# Patient Record
Sex: Male | Born: 1970 | Race: White | Hispanic: No | Marital: Married | Smoking: Never smoker
Health system: Southern US, Community
[De-identification: ages and names within clinical notes are randomized; demographics above are authoritative.]

## PROBLEM LIST (undated history)

## (undated) HISTORY — PX: TONSILLECTOMY: SHX5217

## (undated) HISTORY — PX: OTHER SURGICAL HISTORY: SHX169

---

## 2006-05-21 ENCOUNTER — Ambulatory Visit (HOSPITAL_BASED_OUTPATIENT_CLINIC_OR_DEPARTMENT_OTHER): Admission: RE | Admit: 2006-05-21 | Discharge: 2006-05-21 | Payer: Self-pay | Admitting: Otolaryngology

## 2006-05-21 ENCOUNTER — Encounter (INDEPENDENT_AMBULATORY_CARE_PROVIDER_SITE_OTHER): Payer: Self-pay | Admitting: Specialist

## 2008-10-14 ENCOUNTER — Emergency Department (HOSPITAL_COMMUNITY): Admission: EM | Admit: 2008-10-14 | Discharge: 2008-10-14 | Payer: Self-pay | Admitting: Emergency Medicine

## 2010-10-10 NOTE — Op Note (Signed)
NAMEAKSHITH, MONCUS              ACCOUNT NO.:  [id number]   MEDICAL RECORD NO.:  [id number]          PATIENT TYPE:  AMB   LOCATION:  DSC                          FACILITY:  [facility]   PHYSICIAN:  Christopher E. Ezzard Standing, M.D.DATE OF BIRTH:  1970-06-02   DATE OF PROCEDURE:  05/21/2006  DATE OF DISCHARGE:                               OPERATIVE REPORT   PREOPERATIVE DIAGNOSIS:  Left peritonsillar abscess.   POSTOPERATIVE DIAGNOSIS:  Left peritonsillar abscess.   OPERATIONS:  Left tonsillectomy.   SURGEON:  Kristine Garbe. Ezzard Standing, M.D.   ANESTHESIA:  General endotracheal.   COMPLICATIONS:  None.   BRIEF CLINICAL NOTE:  Carlos Estes is a 40 year old gentleman who has  had intermittent problems with left-sided sore throats.  He had one  episode back in February and then another episode just recently in  November and December where he had persistent left throat pain and left  ear pain.  He was treated with antibiotics.  On examination, he has what  appears to be either cellulitis or abscess in the left peritonsillar  area.  Of note, he had tonsillectomy performed when he was a teenager,  and on exam in the office he appears to have some residual left superior  tonsillar tissue, and he has either cellulitis or abscess secondary to  this remaining tonsil tissue.  Because of recurrent problems, he is  taken to the operating room at this time for a left tonsillectomy.   DESCRIPTION OF PROCEDURE:  After adequate endotracheal anesthesia, the  patient received 1 gram of Ancef  and 10 mg of Decadron IV  preoperatively.  The patient had some induration and firm mass in the  left peritonsillar area superiorly.  This tissue was excised along with  a portion of the soft palate.  Dissection was carried down to the  pharyngeal muscles.  There was inflamed tonsil tissue in this area that  was removed.  Hemostasis was obtained with cautery.  The specimen was  sent to pathology.  Carlos Estes tolerated  this well.  He was awakened from  anesthesia and transferred to the recovery room postoperatively doing  well.   DISPOSITION:  Carlos Estes was discharged home later this morning on  amoxicillin suspension 500 mg b.i.d. for 1 week, Lortab elixir 1-2  tablespoons q.4h. p.r.n. pain, Phenergan for nausea.  We will have him  follow up in my office in 2 weeks for recheck.           ______________________________  Kristine Garbe. Ezzard Standing, M.D.    CEN/MEDQ  D:  05/21/2006  T:  05/21/2006  Job:  [id number]   cc:   Sigmund Hazel, M.D.

## 2011-09-11 ENCOUNTER — Ambulatory Visit: Payer: BC Managed Care – PPO

## 2011-09-11 ENCOUNTER — Telehealth: Payer: Self-pay | Admitting: Family Medicine

## 2011-09-11 ENCOUNTER — Ambulatory Visit: Payer: BC Managed Care – PPO | Admitting: Physician Assistant

## 2011-09-11 DIAGNOSIS — R079 Chest pain, unspecified: Secondary | ICD-10-CM

## 2011-09-11 LAB — POCT CBC
Lymph, poc: 1.9 (ref 0.6–3.4)
MCH, POC: 30 pg (ref 27–31.2)
MCHC: 33.8 g/dL (ref 31.8–35.4)
MID (cbc): 0.4 (ref 0–0.9)
MPV: 9.7 fL (ref 0–99.8)
POC MID %: 6.3 %M (ref 0–12)
Platelet Count, POC: 253 10*3/uL (ref 142–424)
WBC: 6.5 10*3/uL (ref 4.6–10.2)

## 2011-09-11 LAB — COMPREHENSIVE METABOLIC PANEL
AST: 20 U/L (ref 0–37)
Alkaline Phosphatase: 65 U/L (ref 39–117)
BUN: 14 mg/dL (ref 6–23)
Calcium: 9.1 mg/dL (ref 8.4–10.5)
Chloride: 107 mEq/L (ref 96–112)
Creat: 1.6 mg/dL — ABNORMAL HIGH (ref 0.50–1.35)

## 2011-09-11 LAB — LIPID PANEL
HDL: 43 mg/dL (ref >39)
Total CHOL/HDL Ratio: 4.4 Ratio
Triglycerides: 163 mg/dL — ABNORMAL HIGH (ref <150)

## 2011-09-11 LAB — TSH: TSH: 1.778 u[IU]/mL (ref 0.350–4.500)

## 2011-09-11 MED ORDER — ALPRAZOLAM 0.5 MG PO TABS: 0.5000 mg | ORAL_TABLET | Freq: Two times a day (BID) | ORAL | Status: AC

## 2011-09-11 NOTE — Patient Instructions (Signed)
We will call with your labs tonight.  If your symptoms worsen, go to the ER.

## 2011-09-11 NOTE — Progress Notes (Signed)
Subjective:    Patient ID: Carlos Estes, male    DOB: 1970-10-03, 41 y.o.   MRN: [id number]  HPI Carlos Estes comes in today with his wife c/o off and on chest pain for 2 weeks.  Feels chest tightness at times with difficulty taking a deep breath.  Has had an episode that his left arm felt "funny".  No diaphoresis or nausea. Happens at rest and with exertion.  Never wakes him from his sleep. Last week he had a vision change and saw a white shiny light in the upper outer margin of his right eye that lasted for ~ 20 minutes.  He was just about to play soccer and continued into the game with full resolution of this symptom.  No HA, dizziness or palpatiions at the time.  That has never happened in the past.  He does report that the chest tightness has been occurring since working more hours since January.  Admits that he is under a great deal of stress and his wife agrees with this.  Job, family life is stressful.  No exercise except for soccer.  Non smoker.  ETOH at night to take the edge of occasionally,  Not sleeping well.  Wakes a lot because his mind is racing.  Has trouble all day long with his mind racing and worrying.  Very worried this is a cardiac problem. GF is the only family member with MI at 43.  Pt has not had any health maintenance in many years.   Review of Systems As above    Objective:   Physical Exam  Constitutional: He is oriented to person, place, and time. Vital signs are normal. He appears well-developed and well-nourished. He does not have a sickly appearance. He does not appear ill. He appears distressed (anxious).  HENT:  Mouth/Throat: Oropharynx is clear and moist.  Eyes: Conjunctivae, EOM and lids are normal. Pupils are equal, round, and reactive to light.  Neck: No mass and no thyromegaly present.  Cardiovascular: Normal rate and regular rhythm.   Pulmonary/Chest: Effort normal and breath sounds normal.  Abdominal: Normal appearance and bowel sounds are normal. There is  no tenderness.  Lymphadenopathy:    He has no cervical adenopathy.  Neurological: He is alert and oriented to person, place, and time.  Skin: Skin is warm. He is not diaphoretic. No pallor.  Psychiatric: His speech is normal and behavior is normal. Judgment and thought content normal. His mood appears anxious. Cognition and memory are normal.   EKG read with Dr. Conley Rolls NL  [facility] reading (PRIMARY) by  Dr. Conley Rolls. Normal CXR   Results for orders placed in visit on 09/11/11  POCT CBC      Component Value Range   WBC 6.5  4.6 - 10.2 (K/uL)   Lymph, poc 1.9  0.6 - 3.4    POC LYMPH PERCENT 30.0  10 - 50 (%L)   MID (cbc) 0.4  0 - 0.9    POC MID % 6.3  0 - 12 (%M)   POC Granulocyte 4.1  2 - 6.9    Granulocyte percent 63.7  37 - 80 (%G)   RBC 5.10  4.69 - 6.13 (M/uL)   Hemoglobin 15.3  14.1 - 18.1 (g/dL)   HCT, POC 40.9  81.1 - 53.7 (%)   MCV 88.8  80 - 97 (fL)   MCH, POC 30.0  27 - 31.2 (pg)   MCHC 33.8  31.8 - 35.4 (g/dL)   RDW, POC 13.5  Platelet Count, POC 253  142 - 424 (K/uL)   MPV 9.7  0 - 99.8 (fL)  GLUCOSE, POCT (MANUAL RESULT ENTRY)      Component Value Range   POC Glucose 76             Assessment & Plan:  Chest pain/tightness, likely stress related  Stress  Discussed with Dr. Conley Rolls.  I feel that pt is having these symptoms from the excess stress but concern for cardiac etiology is present but minimal.  Will check CMET, TSH, Lipid and Troponin level.  If elevated, he is instructed to go to the ER.  If normal, reassurance that this is not cardiac related and likely due to stress.  Discussed use of SSRI and Xanax to temporize his symptoms and reviewed relaxation techniques.  He and his wife agree with this plan and will discuss the use of meds while we await Troponin levels.  He is given a copy of his EKG and will go to the ER if symptoms worsen at all.

## 2011-09-11 NOTE — Telephone Encounter (Signed)
Spoke with patient regarding negative xray and troponin.

## 2011-10-01 ENCOUNTER — Encounter: Payer: Self-pay | Admitting: Physician Assistant

## 2012-09-25 ENCOUNTER — Ambulatory Visit: Payer: BC Managed Care – PPO | Admitting: Family Medicine

## 2012-09-25 ENCOUNTER — Ambulatory Visit: Payer: BC Managed Care – PPO

## 2012-09-25 DIAGNOSIS — R079 Chest pain, unspecified: Secondary | ICD-10-CM

## 2012-09-25 DIAGNOSIS — F411 Generalized anxiety disorder: Secondary | ICD-10-CM

## 2012-09-25 MED ORDER — ALPRAZOLAM 0.5 MG PO TBDP: 0.5000 mg | ORAL_TABLET | Freq: Two times a day (BID) | ORAL | Status: DC | PRN

## 2012-09-25 NOTE — Progress Notes (Signed)
Subjective: Patient has been having chest pain off and on over the last year since he was seen here. He says when he was taking the antianxiety medications it did help some. However he did not like taking them. The last couple of days she's been having more frequent episodes of substernal pain. He says he feels a full flushed feeling up into his neck. No dyspnea. He had also has pain into the left clavicle area. He is been very anxious. He's been under a lot of stress. He is married with 4 children. His wife is with him today. She had to make him come in. He drives to [facility] every day for his job. He has a [employer] treasurer. He plays soccer twice a week, never has any exertional chest pain. His weekends build his anxiety and he gets more pains then. Family history for heart disease is negative. He does not smoke. He does not have a history of high cholesterol. No history of elevated blood pressure.  Objective: Anxious appearing man in no major distress. The pain is not bothering him right now though he says it has while he has been in the office. Throat was clear. Neck supple without nodes or thyromegaly. Chest is clear to auscultation. Heart regular without murmurs gallops or arrhythmias. Abdomen soft without masses or tenderness. No chest wall tenderness.  EKG has nonspecific ST-T wave flattening and minimal T. inversion in V2 3 and 4, different from one year ago.  Assessment: Chest pain Anxiety  Plan: I spoke with Dr. Jacinto Halim who will arrange for the patient to be seen tomorrow. The patient was instructed if he gets acute pain he is to go to the emergency room. He can tell them to contact Dr. Jacinto Halim . I do not want him to drive back and forth [facility] for the next couple of days. He needs a little break. Prescribed alprazolam for anxiety. Advised him to go ahead and take aspirin 81 mg daily. Gave him aspirin 325 one dose here.

## 2012-09-25 NOTE — Patient Instructions (Addendum)
Sent home tomorrow and do not go to [facility] until you see the cardiologist.  Dr.Ganji will contact you tomorrow regarding when you can see him. In the event that you do not hear from him, call our office.  In the event of acute chest pain go to the emergency room  Take aspirin 81 mg daily  Take the alprazolam one pill twice daily as needed for anxiety

## 2012-09-26 ENCOUNTER — Telehealth: Payer: Self-pay

## 2012-09-26 NOTE — Telephone Encounter (Signed)
Note provided. Patient advised to return to clinic or ER if worse before appt with Dr Jacinto Halim.

## 2012-09-26 NOTE — Telephone Encounter (Signed)
Referrals unable to get pt appt until Wednesday this week at ganji's office. Pt requests oow note until then based on what he and dr discussed at last ov.   Best: [phone]  bf

## 2012-09-26 NOTE — Telephone Encounter (Signed)
Last phone message closed in error.  Pt needs oow note extended until Wednesday. Referrals not able to make appt until Wednesday this week at ganji's office. Pt thought hed have one before then based on conversation w/dr at last ov. Please call pt.  Best: [phone]  bf

## 2012-11-18 ENCOUNTER — Encounter: Payer: Self-pay | Admitting: Family Medicine

## 2013-04-13 ENCOUNTER — Telehealth: Payer: Self-pay

## 2013-04-13 DIAGNOSIS — F411 Generalized anxiety disorder: Secondary | ICD-10-CM

## 2013-04-13 MED ORDER — ALPRAZOLAM 0.5 MG PO TBDP: 0.5000 mg | ORAL_TABLET | Freq: Two times a day (BID) | ORAL | Status: DC | PRN

## 2013-04-13 NOTE — Telephone Encounter (Signed)
Patient is working in [employer] - very much in need of ALPRAZolam (NIRAVAM) 0.5 MG dissolvable tablet Wife is concerned - he keeps talking of his anxiety.  She can drive him down a couple of pills and he can is home and available to come in next week.    Carlos Estes  [phone]

## 2013-04-13 NOTE — Telephone Encounter (Signed)
Ready to send into [place] to pharmacy of patient's choice.

## 2013-04-13 NOTE — Telephone Encounter (Signed)
He was last here in May, will you renew?

## 2013-04-13 NOTE — Telephone Encounter (Signed)
Pt wife stated he will be in to be seen and rx sent to pharmacy

## 2014-07-17 ENCOUNTER — Ambulatory Visit (INDEPENDENT_AMBULATORY_CARE_PROVIDER_SITE_OTHER): Payer: BLUE CROSS/BLUE SHIELD | Admitting: Urgent Care

## 2014-07-17 VITALS — BP 122/80 | HR 61 | Temp 98.3°F | Resp 18 | Ht 75.0 in | Wt 271.0 lb

## 2014-07-17 DIAGNOSIS — Z889 Allergy status to unspecified drugs, medicaments and biological substances status: Secondary | ICD-10-CM

## 2014-07-17 DIAGNOSIS — R0982 Postnasal drip: Secondary | ICD-10-CM

## 2014-07-17 DIAGNOSIS — K219 Gastro-esophageal reflux disease without esophagitis: Secondary | ICD-10-CM

## 2014-07-17 MED ORDER — ESOMEPRAZOLE MAGNESIUM 20 MG PO CPDR: 20.0000 mg | DELAYED_RELEASE_CAPSULE | Freq: Every day | ORAL | Status: DC

## 2014-07-17 MED ORDER — FLUTICASONE PROPIONATE 50 MCG/ACT NA SUSP: 2.0000 | Freq: Every day | NASAL | Status: DC

## 2014-07-17 NOTE — Patient Instructions (Signed)

## 2014-07-17 NOTE — Progress Notes (Signed)
MRN: [id number] DOB: May 15, 1971  Subjective:   Carlos Estes is a 44 y.o. male presenting for chief complaint of Cough and Nasal Congestion  Reports 2 month history of chest cold that has now moved up to his sinuses. Currently complains of postnasal drip, feel sinus pressure, ear pressure, intermittent red and itchy eyes. Has tried thera-flu and robitussin intermittently with minimal to no relief. Denies fevers, itchy or watery eyes, ear pain, tooth pain, gum pain, sore throat, cough, chest congestion, chest pain, chest tightness, shob, wheezing. Plenty of sick contacts at home with their kids initially, all have improved except for patient. Has a history of allergies, bronchitis, asthma when he was living in [place] ~11 years ago; used to get frequent infections and had a hard time with his allergies. Of note, patient has 2 cats and a dog at home, states that it's difficult to avoid not having them in the bedroom or avoid pet dander. Denies smoking, about 2 drinks of alcohol per week.   Also reports several month history of throat discomfort. States that he feels like he's having a spasm in his upper throat, typically associated with food and worsened with lying down. Patient has experienced relief at times with using Tums, GasX but ranitidine has not helped. He has never tried PPI. Patient admits diet consists of 2 cups of coffee daily, pizzas, sodas, used to eat a lot of chocolate but has cut back on this. Denies any other aggravating or relieving factors, no other questions or concerns.  Carlos Estes currently has no medications in their medication list. He has No Known Allergies.  Carlos Estes  has no past medical history on file. Also  has past surgical history that includes arm surgery and Tonsillectomy.  ROS As in subjective.  Objective:   Vitals: BP 122/80 mmHg  Pulse 61  Temp(Src) 98.3 F (36.8 C) (Oral)  Resp 18  Ht  (1.905 m)  Wt 271 lb (122.925 kg)  BMI 33.87 kg/m2  SpO2  98%  Physical Exam  Constitutional: He is oriented to person, place, and time and well-developed, well-nourished, and in no distress.  HENT:  TM's intact bilaterally, no effusions or erythema. Nasal turbinates boggy and edematous. No sinus tenderness. Postnasal drip present, without oropharyngeal exudates. Mucous membranes moist.  Eyes: Conjunctivae are normal. Right eye exhibits no discharge. Left eye exhibits no discharge. No scleral icterus.  Cardiovascular: Normal rate, regular rhythm, normal heart sounds and intact distal pulses.  Exam reveals no gallop and no friction rub.   No murmur heard. Pulmonary/Chest: No stridor. No respiratory distress. He has no wheezes. He has no rales. He exhibits no tenderness.  Abdominal: Bowel sounds are normal. He exhibits no distension and no mass. There is no tenderness.  Lymphadenopathy:    He has no cervical adenopathy.  Neurological: He is alert and oriented to person, place, and time.  Skin: Skin is warm and dry. No rash noted. No erythema.   Assessment and Plan :   1. Postnasal drip 2. History of seasonal allergies - Likely due to uncontrolled allergies given history and current chronic exposure to potential allergens. - Advised to avoid pet dander as much as possible - Start nasal steroid for 4 weeks, follow up then, consider allergy testing if no improvement and addition of oral antihistamine.  3. Gastroesophageal reflux disease, esophagitis presence not specified - Start trial of PPI, dietary modifications. - Follow up in 4 weeks if no improvement  Wallis Bamberg, PA-C [facility] [phone] 07/17/2014 2:31 PM

## 2014-07-18 NOTE — Progress Notes (Signed)
  Medical screening examination/treatment/procedure(s) were performed by non-physician practitioner and as supervising physician I was immediately available for consultation/collaboration.     

## 2014-08-03 ENCOUNTER — Encounter (HOSPITAL_COMMUNITY): Payer: Self-pay | Admitting: *Deleted

## 2014-08-03 ENCOUNTER — Emergency Department (HOSPITAL_COMMUNITY)
Admission: EM | Admit: 2014-08-03 | Discharge: 2014-08-03 | Disposition: A | Discharge status: Home / Self Care | Payer: BLUE CROSS/BLUE SHIELD | Attending: Emergency Medicine | Admitting: Emergency Medicine

## 2014-08-03 ENCOUNTER — Emergency Department (HOSPITAL_COMMUNITY): Payer: BLUE CROSS/BLUE SHIELD

## 2014-08-03 DIAGNOSIS — Z7951 Long term (current) use of inhaled steroids: Secondary | ICD-10-CM | POA: Diagnosis not present

## 2014-08-03 DIAGNOSIS — R5383 Other fatigue: Secondary | ICD-10-CM | POA: Diagnosis not present

## 2014-08-03 DIAGNOSIS — R531 Weakness: Secondary | ICD-10-CM | POA: Diagnosis present

## 2014-08-03 DIAGNOSIS — Z8659 Personal history of other mental and behavioral disorders: Secondary | ICD-10-CM | POA: Insufficient documentation

## 2014-08-03 DIAGNOSIS — Z79899 Other long term (current) drug therapy: Secondary | ICD-10-CM | POA: Insufficient documentation

## 2014-08-03 DIAGNOSIS — R109 Unspecified abdominal pain: Secondary | ICD-10-CM | POA: Insufficient documentation

## 2014-08-03 LAB — CBC WITH DIFFERENTIAL/PLATELET
BASOS ABS: 0 10*3/uL (ref 0.0–0.1)
Basophils Relative: 0 % (ref 0–1)
Eosinophils Absolute: 0.1 10*3/uL (ref 0.0–0.7)
Eosinophils Relative: 2 % (ref 0–5)
HEMATOCRIT: 44.6 % (ref 39.0–52.0)
Hemoglobin: 15.7 g/dL (ref 13.0–17.0)
LYMPHS ABS: 2.4 10*3/uL (ref 0.7–4.0)
LYMPHS PCT: 36 % (ref 12–46)
MCH: 29.8 pg (ref 26.0–34.0)
MCHC: 35.2 g/dL (ref 30.0–36.0)
MCV: 84.8 fL (ref 78.0–100.0)
MONO ABS: 0.4 10*3/uL (ref 0.1–1.0)
Monocytes Relative: 5 % (ref 3–12)
Neutro Abs: 3.8 10*3/uL (ref 1.7–7.7)
Neutrophils Relative %: 57 % (ref 43–77)
PLATELETS: 221 10*3/uL (ref 150–400)
RBC: 5.26 MIL/uL (ref 4.22–5.81)
RDW: 12.1 % (ref 11.5–15.5)
WBC: 6.7 10*3/uL (ref 4.0–10.5)

## 2014-08-03 LAB — COMPREHENSIVE METABOLIC PANEL
ALBUMIN: 4.5 g/dL (ref 3.5–5.2)
ALK PHOS: 63 U/L (ref 39–117)
ALT: 25 U/L (ref 0–53)
AST: 20 U/L (ref 0–37)
Anion gap: 9 (ref 5–15)
BILIRUBIN TOTAL: 0.6 mg/dL (ref 0.3–1.2)
BUN: 15 mg/dL (ref 6–23)
CHLORIDE: 104 mmol/L (ref 96–112)
CO2: 26 mmol/L (ref 19–32)
CREATININE: 1.5 mg/dL — AB (ref 0.50–1.35)
Calcium: 9.6 mg/dL (ref 8.4–10.5)
GFR calc Af Amer: 64 mL/min — ABNORMAL LOW (ref >90)
GFR, EST NON AFRICAN AMERICAN: 55 mL/min — AB (ref >90)
Glucose, Bld: 91 mg/dL (ref 70–99)
POTASSIUM: 4 mmol/L (ref 3.5–5.1)
SODIUM: 139 mmol/L (ref 135–145)
Total Protein: 7.5 g/dL (ref 6.0–8.3)

## 2014-08-03 NOTE — ED Provider Notes (Addendum)
CSN: [id number]     Arrival date & time 08/03/14  1915 History   First MD Initiated Contact with Patient 08/03/14 2234     Chief Complaint  Patient presents with  . feeling tired      (Consider location/radiation/quality/duration/timing/severity/associated sxs/prior Treatment) HPI Patient complains of vague abdominal discomfort, diffuse onset several months ago but feeling as if it is "heartburn" for the past 2 weeks. Pain is improved markedly after taking Nexium and somewhat after taking ranitidine. Pain is improved also with eating. Not made worse with exertion and not made worse with anything. Patient plays soccer 2 or 3 times each week. Other associated symptoms include a metallic taste in his mouth for several months and fatigue for several months no shortness of breath no chest pain patient states he stopped taking Nexium because shortly after taking Nexium he developed diffuse arthralgias. History reviewed. No pertinent past medical history. past medical history anxiety Past Surgical History  Procedure Laterality Date  . Arm surgery    . Tonsillectomy     Family History  Problem Relation Age of Onset  . Hypertension Father    History  Substance Use Topics  . Smoking status: Never Smoker   . Smokeless tobacco: Not on file  . Alcohol Use: 0.0 oz/week    0 Standard drinks or equivalent per week   drinks approximate 5 or 6 alcoholic beverages per week. No illicit drug use  Review of Systems  Constitutional: Positive for fatigue.  Gastrointestinal: Positive for abdominal pain.  All other systems reviewed and are negative.     Allergies  Review of patient's allergies indicates no known allergies.  Home Medications   Prior to Admission medications   Medication Sig Start Date End Date Taking? Authorizing Provider  esomeprazole (NEXIUM) 20 MG capsule Take 1 capsule (20 mg total) by mouth daily. 07/17/14   Wallis BambergMario Mani, PA-C  fluticasone [facility](FLONASE) 50 MCG/ACT nasal spray Place  2 sprays into both nostrils daily. 07/17/14   Wallis BambergMario Mani, PA-C   BP 138/75 mmHg  Pulse 72  Temp(Src) 98.7 F (37.1 C)  Resp 16  Ht 6\' 3"  (1.905 m)  Wt 267 lb (121.11 kg)  BMI 33.37 kg/m2  SpO2 99% Physical Exam  Constitutional: He appears well-developed and well-nourished.  HENT:  Head: Normocephalic and atraumatic.  Eyes: Conjunctivae are normal. Pupils are equal, round, and reactive to light.  Neck: Neck supple. No tracheal deviation present. No thyromegaly present.  Cardiovascular: Normal rate and regular rhythm.   No murmur heard. Pulmonary/Chest: Effort normal and breath sounds normal.  Abdominal: Soft. Bowel sounds are normal. He exhibits no distension. There is no tenderness.  Musculoskeletal: Normal range of motion. He exhibits no edema or tenderness.  Neurological: He is alert. Coordination normal.  Skin: Skin is warm and dry. No rash noted.  Psychiatric: He has a normal mood and affect.  Nursing note and vitals reviewed.   ED Course  Procedures (including critical care time) Labs Review Labs Reviewed  COMPREHENSIVE METABOLIC PANEL - Abnormal; Notable for the following:    Creatinine, Ser 1.50 (*)    GFR calc non Af Amer 55 (*)    GFR calc Af Amer 64 (*)    All other components within normal limits  CBC WITH DIFFERENTIAL/PLATELET    Imaging Review Dg Chest 2 View  08/03/2014   CLINICAL DATA:  LEFT chest pain for 2 weeks  EXAM: CHEST  2 VIEW  COMPARISON:  09/11/2011  FINDINGS: Normal heart size, mediastinal contours,  and pulmonary vascularity.  Mild peribronchial thickening.  No pulmonary infiltrate, pleural effusion, or pneumothorax.  Bones unremarkable.  IMPRESSION: Bronchitic changes without acute infiltrate.   Electronically Signed   By: Ulyses Southward M.D.   On: 08/03/2014 21:04     EKG Interpretation None      Results for orders placed or performed during the hospital encounter of 08/03/14  CBC with Differential  Result Value Ref Range   WBC 6.7 4.0 -  10.5 K/uL   RBC 5.26 4.22 - 5.81 MIL/uL   Hemoglobin 15.7 13.0 - 17.0 g/dL   HCT 65.7 84.6 - 96.2 %   MCV 84.8 78.0 - 100.0 fL   MCH 29.8 26.0 - 34.0 pg   MCHC 35.2 30.0 - 36.0 g/dL   RDW 95.2 84.1 - 32.4 %   Platelets 221 150 - 400 K/uL   Neutrophils Relative % 57 43 - 77 %   Neutro Abs 3.8 1.7 - 7.7 K/uL   Lymphocytes Relative 36 12 - 46 %   Lymphs Abs 2.4 0.7 - 4.0 K/uL   Monocytes Relative 5 3 - 12 %   Monocytes Absolute 0.4 0.1 - 1.0 K/uL   Eosinophils Relative 2 0 - 5 %   Eosinophils Absolute 0.1 0.0 - 0.7 K/uL   Basophils Relative 0 0 - 1 %   Basophils Absolute 0.0 0.0 - 0.1 K/uL  Comprehensive metabolic panel  Result Value Ref Range   Sodium 139 135 - 145 mmol/L   Potassium 4.0 3.5 - 5.1 mmol/L   Chloride 104 96 - 112 mmol/L   CO2 26 19 - 32 mmol/L   Glucose, Bld 91 70 - 99 mg/dL   BUN 15 6 - 23 mg/dL   Creatinine, Ser 4.01 (H) 0.50 - 1.35 mg/dL   Calcium 9.6 8.4 - 02.7 mg/dL   Total Protein 7.5 6.0 - 8.3 g/dL   Albumin 4.5 3.5 - 5.2 g/dL   AST 20 0 - 37 U/L   ALT 25 0 - 53 U/L   Alkaline Phosphatase 63 39 - 117 U/L   Total Bilirubin 0.6 0.3 - 1.2 mg/dL   GFR calc non Af Amer 55 (L) >90 mL/min   GFR calc Af Amer 64 (L) >90 mL/min   Anion gap 9 5 - 15   Dg Chest 2 View  08/03/2014   CLINICAL DATA:  LEFT chest pain for 2 weeks  EXAM: CHEST  2 VIEW  COMPARISON:  09/11/2011  FINDINGS: Normal heart size, mediastinal contours, and pulmonary vascularity.  Mild peribronchial thickening.  No pulmonary infiltrate, pleural effusion, or pneumothorax.  Bones unremarkable.  IMPRESSION: Bronchitic changes without acute infiltrate.   Electronically Signed   By: Ulyses Southward M.D.   On: 08/03/2014 21:04    Chest xray viewed by me. MDM   Final diagnoses:  None   I strongly doubt cardiac etiology of patient's symptoms.  Doubt bronchitis,. No cough.They are nonexertional. Patient exercises regularly I suggest the patient that he take Maalox 2 tablespoons after meals and at bedtime  as well as ranitidine. Symptoms seem predominantly GI in etiology. He is been given a referral to Dr.Pyrtle, gastroenterologist. He is also been informed about renal insufficiency which is chronic and actually somewhat improved over 2013. He is asked to follow up with his primary care physician at [facility] urgent care regarding renal insufficiency Diagnosis #1 nonspecific abdominal pain #2 chronic renal insufficiency       Doug Sou, MD 08/03/14 2536  Doug Sou, MD  08/03/14 2323 

## 2014-08-03 NOTE — Discharge Instructions (Signed)
Abdominal Pain Take ranitidine as directed. He should also try Maalox 2 tablespoons after meals and at bedtime. Call Dr. Rhea BeltonPyrtle on Monday, 08/06/2014 to schedule next available office visit. Your blood creatinine, which is a test of your kidney function is mildly elevated at 1.5. It was 1.6 and 2013. It should be rechecked periodically by your physician at [facility] urgent care. Many things can cause abdominal pain. Usually, abdominal pain is not caused by a disease and will improve without treatment. It can often be observed and treated at home. Your health care provider will do a physical exam and possibly order blood tests and X-rays to help determine the seriousness of your pain. However, in many cases, more time must pass before a clear cause of the pain can be found. Before that point, your health care provider may not know if you need more testing or further treatment. HOME CARE INSTRUCTIONS  Monitor your abdominal pain for any changes. The following actions may help to alleviate any discomfort you are experiencing:  Only take over-the-counter or prescription medicines as directed by your health care provider.  Do not take laxatives unless directed to do so by your health care provider.  Try a clear liquid diet (broth, tea, or water) as directed by your health care provider. Slowly move to a bland diet as tolerated. SEEK MEDICAL CARE IF:  You have unexplained abdominal pain.  You have abdominal pain associated with nausea or diarrhea.  You have pain when you urinate or have a bowel movement.  You experience abdominal pain that wakes you in the night.  You have abdominal pain that is worsened or improved by eating food.  You have abdominal pain that is worsened with eating fatty foods.  You have a fever. SEEK IMMEDIATE MEDICAL CARE IF:   Your pain does not go away within 2 hours.  You keep throwing up (vomiting).  Your pain is felt only in portions of the abdomen, such as the right  side or the left lower portion of the abdomen.  You pass bloody or black tarry stools. MAKE SURE YOU:  Understand these instructions.   Will watch your condition.   Will get help right away if you are not doing well or get worse.  Document Released: 02/18/2005 Document Revised: 05/16/2013 Document Reviewed: 01/18/2013 [facility] Patient Information 2015 [employer] [place]. This information is not intended to replace advice given to you by your health care provider. Make sure you discuss any questions you have with your health care provider.

## 2014-08-03 NOTE — ED Notes (Signed)
Hx of anxiety 

## 2014-08-03 NOTE — ED Notes (Signed)
The pt is c/o feeling tired sl abd pain strange taste in his mouth for 2 weeks  He has been at urgent care  And he was given nexium and an antibiotic.  He was dx  With a sinus infection and  heartburn

## 2014-08-03 NOTE — ED Notes (Signed)
The pt is c/o lt rib pain also and his pain gets better if he eats

## 2014-08-03 NOTE — ED Notes (Signed)
Pt sts he feels very thirsty and then gets a metallic taste in his mouth.    Also concerned about abdominal pain occuring below left ribs.  Sts it felt warm for approx 1 year in that area, but in the past few weeks the pt sts it has felt more like a "burning"  Pt sts he has a swelling feeling that occurs intermittently, pt sts it "feels like someone is grabbing my throat, but I can still breathe".

## 2014-08-03 NOTE — ED Notes (Signed)
Pt complains of "muscle fatigue" after doing very small lifting.  Sts he will pick up his work bag in the morning and "it feels like I've done 50 reps at the gym".  Pt also sts he constantly feels like he could take a nap.  Sts only a few hours after waking up he begins to feel sleepy again.

## 2014-08-06 ENCOUNTER — Encounter: Payer: Self-pay | Admitting: Gastroenterology

## 2014-08-08 ENCOUNTER — Other Ambulatory Visit: Payer: Self-pay | Admitting: Dermatology

## 2014-08-17 ENCOUNTER — Telehealth: Payer: Self-pay

## 2014-08-17 NOTE — Telephone Encounter (Signed)
PA started for generic Nexium. Awaiting response.

## 2014-08-22 MED ORDER — ESOMEPRAZOLE MAGNESIUM 20 MG PO CPDR: DELAYED_RELEASE_CAPSULE | ORAL | Status: DC

## 2014-08-22 NOTE — Telephone Encounter (Signed)
PA denied for Nexium and [employer] will cover the brand Nexium. Brand Nexium sent in.

## 2014-10-16 ENCOUNTER — Ambulatory Visit (INDEPENDENT_AMBULATORY_CARE_PROVIDER_SITE_OTHER): Payer: BLUE CROSS/BLUE SHIELD | Admitting: Gastroenterology

## 2014-10-16 ENCOUNTER — Encounter: Payer: Self-pay | Admitting: Gastroenterology

## 2014-10-16 VITALS — BP 120/80 | HR 68 | Ht 75.0 in | Wt 273.8 lb

## 2014-10-16 DIAGNOSIS — R1012 Left upper quadrant pain: Secondary | ICD-10-CM | POA: Diagnosis not present

## 2014-10-16 DIAGNOSIS — R12 Heartburn: Secondary | ICD-10-CM

## 2014-10-16 NOTE — Patient Instructions (Signed)
You will be set up for an upper endoscopy for LUQ pains. Stop nexium. Start OTC omeprazole 20mg  pill, take 20-930min prior to a meal. You will have labs checked today in the basement lab.  Please head down after you check out with the front desk  (bmet).

## 2014-10-16 NOTE — Progress Notes (Signed)
HPI: This is a very pleasant 44 year old man who was referred to me by his wife to evaluate  pyrosis, left upper quadrant pain .    Chief complaint is pyrosis, left upper quadrant pain  Labs: Cbc, cmet 07/2014  months ago was normal except Cr 1.5  He has LUQ pain, nearly daily.  2-3 out of 10 on a scale.  Feels sore, almost muscular.  + changes with positions, stretching.  Intermittent.   Has a lot of pyrosis. Has cut back on spicy foods, caffeine.  Can occur anytime.  Has been on nexium 20mg  for past month or so.  Takes it intermittently, seems to help at times.  Has hip pains when he takes nexium.   Has tried gas ex, tums, H2 blocker. Was taking h2 bid and it seemed to help.  Pain in low back at times.  About a year ago, cardiology workup (stress testing) all negative.  They said he may have stomach issues.  Has slightly elevated Cr, never seen a nephrologist.  Took advil a lot: currently 2 pills a week, previously more.  Overall weight up; 20 pounds in 6 months.  4 children (15, 3813, 44 yo twins).  nexium has been in AM; usually an hour before BF.    Review of systems: Pertinent positive and negative review of systems were noted in the above HPI section. Complete review of systems was performed and was otherwise normal.   History reviewed. No pertinent past medical history.  Past Surgical History  Procedure Laterality Date  . Arm surgery    . Tonsillectomy      Current Outpatient Prescriptions  Medication Sig Dispense Refill  . esomeprazole (NEXIUM) 20 MG capsule Take 1 capsule (20 mg total) by mouth daily. 30 capsule 1   No current facility-administered medications for this visit.    Allergies as of 10/16/2014  . (No Known Allergies)    Family History  Problem Relation Age of Onset  . Hypertension Father     History   Social History  . Marital Status: Single    Spouse Name: N/A  . Number of Children: N/A  . Years of Education: N/A   Occupational  History  . [employer]    Social History Main Topics  . Smoking status: Never Smoker   . Smokeless tobacco: Never Used  . Alcohol Use: 0.0 oz/week    0 Standard drinks or equivalent per week  . Drug Use: No  . Sexual Activity: Not on file   Other Topics Concern  . Not on file   Social History Narrative     Physical Exam: BP 120/80 mmHg  Pulse 68  Ht 6\' 3"  (1.905 m)  Wt 273 lb 12.8 oz (124.195 kg)  BMI 34.22 kg/m2 Constitutional: generally well-appearing Psychiatric: alert and oriented x3 Eyes: extraocular movements intact Mouth: oral pharynx moist, no lesions Neck: supple no lymphadenopathy Cardiovascular: heart regular rate and rhythm Lungs: clear to auscultation bilaterally Abdomen: soft, nontender, nondistended, no obvious ascites, no peritoneal signs, normal bowel sounds Extremities: no lower extremity edema bilaterally Skin: no lesions on visible extremities   Assessment and plan: 44 y.o. male with  GERD-like symptoms, also left upper quadrant pain  His left upper quadrant pain is somewhat positional, feels like a sore muscle at times. I suspect it is not GI related however it does tend to be postprandial and I recommended EGD to rule out gastritis, peptic ulcer disease, H. pylori infection. He does seem to have GERD  and associated pyrosis. Nexium seems to correlate with hip pains which is not common reaction. I recommended he change to omeprazole over-the-counter strength and he will start taking it 20-30 minutes before a meal and we will see how he responds. His creatinine has been elevated over the past year 1.5, 1.6. I'm going to recheck that level today.   Rob Bunting, MD [facility] 10/16/2014, 9:18 AM

## 2014-10-29 ENCOUNTER — Encounter: Payer: Self-pay | Admitting: Internal Medicine

## 2014-11-14 ENCOUNTER — Encounter: Payer: BLUE CROSS/BLUE SHIELD | Admitting: Gastroenterology

## 2015-04-16 ENCOUNTER — Ambulatory Visit (INDEPENDENT_AMBULATORY_CARE_PROVIDER_SITE_OTHER): Payer: BLUE CROSS/BLUE SHIELD | Admitting: Physician Assistant

## 2015-04-16 VITALS — BP 130/78 | HR 71 | Temp 98.5°F | Resp 16 | Ht 75.0 in | Wt 269.0 lb

## 2015-04-16 DIAGNOSIS — F419 Anxiety disorder, unspecified: Secondary | ICD-10-CM | POA: Diagnosis not present

## 2015-04-16 DIAGNOSIS — R42 Dizziness and giddiness: Secondary | ICD-10-CM | POA: Diagnosis not present

## 2015-04-16 LAB — POCT CBC
Granulocyte percent: 63.1 %G (ref 37–80)
HCT, POC: 45.2 % (ref 43.5–53.7)
Hemoglobin: 15.8 g/dL (ref 14.1–18.1)
Lymph, poc: 1.6 (ref 0.6–3.4)
MCH, POC: 30.7 pg (ref 27–31.2)
MCHC: 35 g/dL (ref 31.8–35.4)
MCV: 87.9 fL (ref 80–97)
MID (cbc): 0.5 (ref 0–0.9)
MPV: 7.9 fL (ref 0–99.8)
POC Granulocyte: 2.6 (ref 2–6.9)
POC LYMPH PERCENT: 28.2 %L (ref 10–50)
POC MID %: 8.7 %M (ref 0–12)
Platelet Count, POC: 216 10*3/uL (ref 142–424)
RBC: 5.14 M/uL (ref 4.69–6.13)
RDW, POC: 12.7 %
WBC: 5.7 10*3/uL (ref 4.6–10.2)

## 2015-04-16 LAB — COMPLETE METABOLIC PANEL WITH GFR
ALT: 18 U/L (ref 9–46)
AST: 12 U/L (ref 10–40)
Albumin: 4.4 g/dL (ref 3.6–5.1)
Alkaline Phosphatase: 61 U/L (ref 40–115)
BUN: 16 mg/dL (ref 7–25)
CO2: 25 mmol/L (ref 20–31)
Calcium: 9.1 mg/dL (ref 8.6–10.3)
Chloride: 105 mmol/L (ref 98–110)
Creat: 1.16 mg/dL (ref 0.60–1.35)
GFR, Est African American: 88 mL/min (ref >=60)
GFR, Est Non African American: 76 mL/min (ref >=60)
Glucose, Bld: 87 mg/dL (ref 65–99)
Potassium: 3.9 mmol/L (ref 3.5–5.3)
Sodium: 139 mmol/L (ref 135–146)
Total Bilirubin: 0.6 mg/dL (ref 0.2–1.2)
Total Protein: 6.6 g/dL (ref 6.1–8.1)

## 2015-04-16 LAB — GLUCOSE, POCT (MANUAL RESULT ENTRY): POC Glucose: 84 mg/dl (ref 70–99)

## 2015-04-16 LAB — TSH: TSH: 2.072 u[IU]/mL (ref 0.350–4.500)

## 2015-04-16 MED ORDER — ESCITALOPRAM OXALATE 10 MG PO TABS: 10.0000 mg | ORAL_TABLET | Freq: Every day | ORAL | Status: DC

## 2015-04-16 NOTE — Progress Notes (Signed)
Carlos Estes  MRN: [id number] DOB: 08/08/1970  Subjective:  Pt presents to clinic with 2 day h/o dizzy, lightheaded feeling but no room spinning. It is not related to anything that he has noticed. He overall feels fine.  These symptoms are not occuring all the time.    He has h/o stress and anxiety and he never knows when he is having stress related physical symptoms or really something medical wrong.  His stress and anxiety affects him daily.  [place] he enjoys but the drive is stressful to him.    There are no active problems to display for this patient.   No current outpatient prescriptions on file prior to visit.   No current facility-administered medications on file prior to visit.    No Known Allergies  Review of Systems  Constitutional: Negative for fever, chills and appetite change.  HENT: Negative for congestion, ear discharge, ear pain, hearing loss, postnasal drip, rhinorrhea and sinus pressure.   Eyes: Negative for photophobia and visual disturbance.  Respiratory: Negative for cough, chest tightness and shortness of breath.   Cardiovascular: Negative for chest pain and palpitations.  Gastrointestinal: Negative for nausea.       Heartburn  Genitourinary: Negative.  Negative for dysuria, frequency and hematuria.  Allergic/Immunologic: Positive for environmental allergies (not having problems currently).  Neurological: Positive for light-headedness. Negative for headaches.  Psychiatric/Behavioral: Negative for sleep disturbance.   Objective:  BP 130/78 mmHg  Pulse 71  Temp(Src) 98.5 F (36.9 C) (Oral)  Resp 16  Ht 6\' 3"  (1.905 m)  Wt 269 lb (122.018 kg)  BMI 33.62 kg/m2  SpO2 98%  Physical Exam  Constitutional: He is oriented to person, place, and time and well-developed, well-nourished, and in no distress.     HENT:  Head: Normocephalic and atraumatic.  Right Ear: Hearing, tympanic membrane, external ear and ear canal  normal.  Left Ear: Hearing, tympanic membrane, external ear and ear canal normal.  Nose: Nose normal.  Mouth/Throat: Uvula is midline, oropharynx is clear and moist and mucous membranes are normal.  Eyes: Conjunctivae and EOM are normal. Pupils are equal, round, and reactive to light.  Neck: Normal range of motion. Neck supple.  Cardiovascular: Normal rate, regular rhythm and normal heart sounds.   No murmur heard. Pulmonary/Chest: Effort normal and breath sounds normal. He has no wheezes.  Lymphadenopathy:       Head (right side): No tonsillar adenopathy present.       Head (left side): No tonsillar adenopathy present.    He has no cervical adenopathy.       Right: No supraclavicular adenopathy present.       Left: No supraclavicular adenopathy present.  Neurological: He is alert and oriented to person, place, and time. Gait normal.  Skin: Skin is warm and dry.  Psychiatric: Mood, memory, affect and judgment normal.    Orthostatic VS for the past 24 hrs:  BP- Lying Pulse- Lying BP- Sitting Pulse- Sitting BP- Standing at 0 minutes Pulse- Standing at 0 minutes  04/16/15 1445 130/84 mmHg 66 130/90 mmHg 63 129/90 mmHg 73   Results for orders placed or performed in visit on 04/16/15  POCT CBC  Result Value Ref Range   WBC 5.7 4.6 - 10.2 K/uL   Lymph, poc 1.6 0.6 - 3.4   POC LYMPH PERCENT 28.2 10 - 50 %L   MID (cbc) 0.5 0 - 0.9   POC MID % 8.7 0 -  12 %M   POC Granulocyte 2.6 2 - 6.9   Granulocyte percent 63.1 37 - 80 %G   RBC 5.14 4.69 - 6.13 M/uL   Hemoglobin 15.8 14.1 - 18.1 g/dL   HCT, POC 16.1 09.6 - 53.7 %   MCV 87.9 80 - 97 fL   MCH, POC 30.7 27 - 31.2 pg   MCHC 35.0 31.8 - 35.4 g/dL   RDW, POC 04.5 %   Platelet Count, POC 216 142 - 424 K/uL   MPV 7.9 0 - 99.8 fL  POCT glucose (manual entry)  Result Value Ref Range   POC Glucose 84 70 - 99 mg/dl   EKG read with Dr Alwyn Ren.  T wave inversion Leads III, V1-V4 no change from 10/12/2012   Assessment and Plan :  Lightheaded  - Plan: POCT CBC, POCT glucose (manual entry), COMPLETE METABOLIC PANEL WITH GFR, TSH, EKG 12-Lead, Orthostatic vital signs  Anxiety - Plan: escitalopram (LEXAPRO) 10 MG tablet   There is no cause of his symptoms today on exam - we will wait for labs and go from there.  He will call Dr Jacinto Halim for an appt (within the next 7-10d) because he had a full work-up with him 2 years ago.  He decided he wanted medication for his anxiety.  We talked about side effects, risks benefits what to expect and how to take the Lexapro.  D/w Dr Nehemiah Settle PA-C  [facility] 04/16/2015 3:47 PM

## 2015-04-16 NOTE — Patient Instructions (Signed)
For therapy -- [facility] ([address], [address] [place], Heather Joycelyn Schmid[place]chen[place], [place]la [place]) - [phone] [facility] [place] [facility] [place]) - [phone] [facility] - [phone] [facility] - [phone] [facility]  - [phone] (do not file insurance)

## 2015-05-17 ENCOUNTER — Other Ambulatory Visit: Payer: Self-pay | Admitting: Physician Assistant

## 2015-06-28 ENCOUNTER — Other Ambulatory Visit: Payer: Self-pay | Admitting: Physician Assistant

## 2015-08-13 ENCOUNTER — Other Ambulatory Visit: Payer: Self-pay | Admitting: Physician Assistant

## 2015-08-22 ENCOUNTER — Ambulatory Visit: Payer: BLUE CROSS/BLUE SHIELD | Admitting: Physician Assistant

## 2015-09-05 ENCOUNTER — Ambulatory Visit: Payer: BLUE CROSS/BLUE SHIELD | Admitting: Physician Assistant

## 2015-09-17 ENCOUNTER — Other Ambulatory Visit: Payer: Self-pay | Admitting: Physician Assistant

## 2015-09-18 IMAGING — DX DG CHEST 2V
2 series · 2 of 2 positions shown · non-contrast
Comparison: 09/11/2011

CLINICAL DATA: LEFT chest pain for 2 weeks

EXAM:
CHEST  2 VIEW

[chest pa]
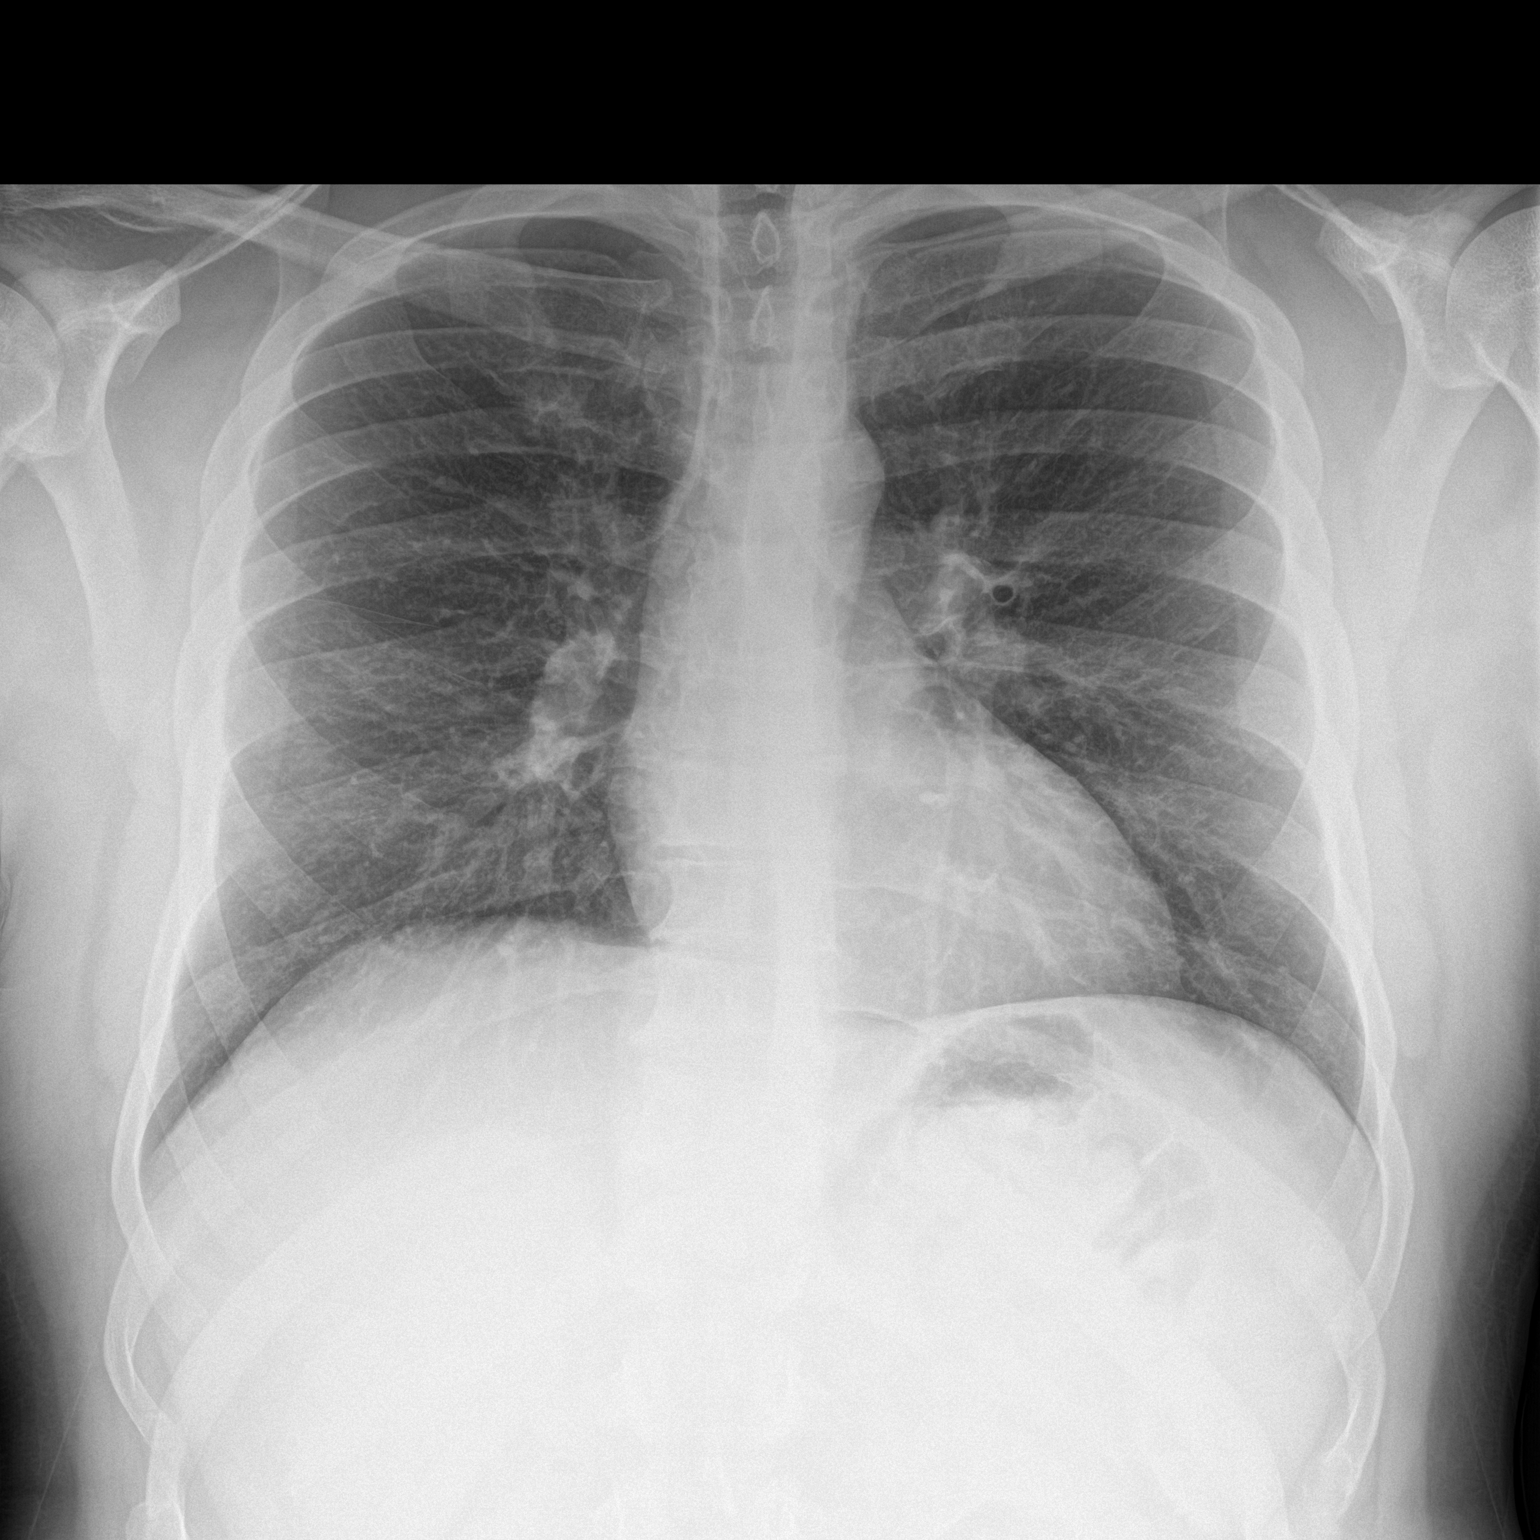

[chest lat]
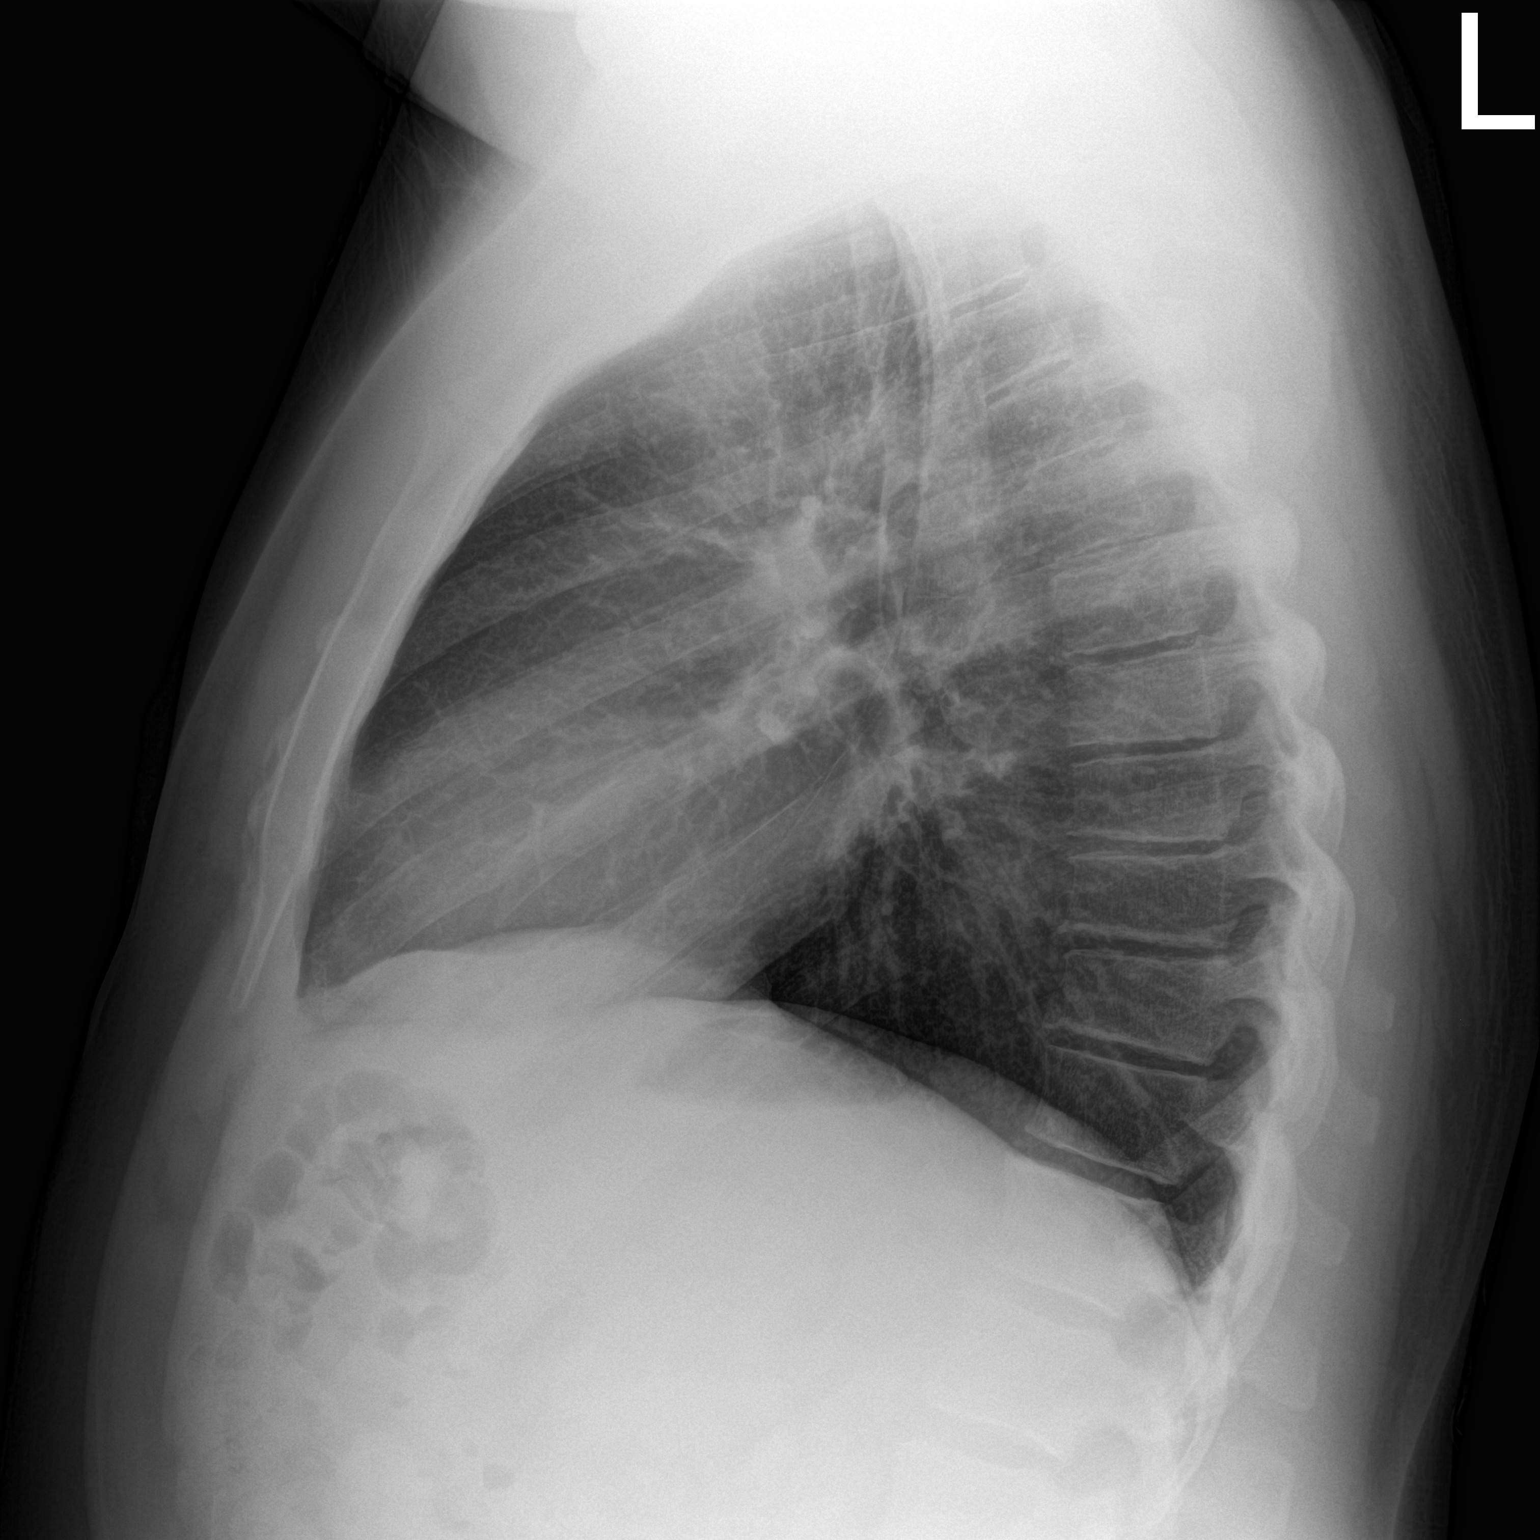

[2 of 2 positions shown; findings below may reference images not displayed]

FINDINGS: Normal heart size, mediastinal contours, and pulmonary vascularity.

Mild peribronchial thickening.

No pulmonary infiltrate, pleural effusion, or pneumothorax.

Bones unremarkable.
IMPRESSION: Bronchitic changes without acute infiltrate.

## 2015-09-27 ENCOUNTER — Other Ambulatory Visit: Payer: Self-pay | Admitting: Physician Assistant
